# Patient Record
Sex: Male | Born: 1935 | Race: White | Hispanic: No | Marital: Married
Health system: Southern US, Community
[De-identification: ages and names within clinical notes are randomized; demographics above are authoritative.]

---

## 2004-04-24 ENCOUNTER — Emergency Department: Payer: Self-pay | Admitting: General Practice

## 2005-11-24 ENCOUNTER — Inpatient Hospital Stay: Payer: Self-pay | Admitting: Internal Medicine

## 2005-11-24 ENCOUNTER — Other Ambulatory Visit: Payer: Self-pay

## 2005-11-27 ENCOUNTER — Emergency Department: Payer: Self-pay | Admitting: Urology

## 2007-07-25 ENCOUNTER — Ambulatory Visit: Payer: Self-pay | Admitting: General Surgery

## 2007-07-31 ENCOUNTER — Ambulatory Visit: Payer: Self-pay | Admitting: General Surgery

## 2009-05-22 ENCOUNTER — Emergency Department: Payer: Self-pay | Admitting: Emergency Medicine

## 2009-06-12 ENCOUNTER — Emergency Department: Payer: Self-pay | Admitting: Emergency Medicine

## 2009-07-02 ENCOUNTER — Emergency Department: Payer: Self-pay | Admitting: Emergency Medicine

## 2009-07-19 ENCOUNTER — Emergency Department: Payer: Self-pay | Admitting: Emergency Medicine

## 2009-07-25 ENCOUNTER — Emergency Department: Payer: Self-pay | Admitting: Emergency Medicine

## 2009-10-22 ENCOUNTER — Emergency Department: Payer: Self-pay | Admitting: Unknown Physician Specialty

## 2009-10-28 ENCOUNTER — Ambulatory Visit: Payer: Self-pay | Admitting: Urology

## 2009-11-10 ENCOUNTER — Ambulatory Visit: Payer: Self-pay | Admitting: Urology

## 2010-01-04 ENCOUNTER — Inpatient Hospital Stay: Payer: Self-pay | Admitting: Internal Medicine

## 2010-05-14 ENCOUNTER — Ambulatory Visit: Payer: Self-pay | Admitting: Urology

## 2010-05-25 ENCOUNTER — Ambulatory Visit: Payer: Self-pay | Admitting: Urology

## 2011-01-27 ENCOUNTER — Ambulatory Visit: Payer: Self-pay

## 2011-01-29 ENCOUNTER — Ambulatory Visit: Payer: Self-pay | Admitting: Oncology

## 2011-02-27 ENCOUNTER — Ambulatory Visit: Payer: Self-pay | Admitting: Oncology

## 2011-03-30 ENCOUNTER — Ambulatory Visit: Payer: Self-pay | Admitting: Oncology

## 2011-04-30 ENCOUNTER — Ambulatory Visit: Payer: Self-pay | Admitting: Oncology

## 2011-05-08 ENCOUNTER — Emergency Department: Payer: Self-pay | Admitting: Emergency Medicine

## 2011-05-08 ENCOUNTER — Ambulatory Visit: Payer: Self-pay | Admitting: Urology

## 2011-05-08 LAB — CBC
HCT: 38.8 % — ABNORMAL LOW (ref 40.0–52.0)
HGB: 13.2 g/dL (ref 13.0–18.0)
MCH: 30.2 pg (ref 26.0–34.0)
MCV: 89 fL (ref 80–100)
RBC: 4.36 10*6/uL — ABNORMAL LOW (ref 4.40–5.90)
RDW: 12.5 % (ref 11.5–14.5)

## 2011-05-08 LAB — URINALYSIS, COMPLETE
RBC,UR: 130864 /HPF (ref 0–5)
RBC,UR: 2113 /HPF (ref 0–5)
Squamous Epithelial: NONE SEEN
Squamous Epithelial: NONE SEEN
WBC UR: 290 /HPF (ref 0–5)

## 2011-05-08 LAB — PROTIME-INR
INR: 1.7
Prothrombin Time: 20.4 secs — ABNORMAL HIGH (ref 11.5–14.7)

## 2011-05-08 LAB — COMPREHENSIVE METABOLIC PANEL
Albumin: 4.2 g/dL (ref 3.4–5.0)
Alkaline Phosphatase: 81 U/L (ref 50–136)
BUN: 22 mg/dL — ABNORMAL HIGH (ref 7–18)
Bilirubin,Total: 1.8 mg/dL — ABNORMAL HIGH (ref 0.2–1.0)
Calcium, Total: 9.5 mg/dL (ref 8.5–10.1)
Co2: 25 mmol/L (ref 21–32)
Creatinine: 1.08 mg/dL (ref 0.60–1.30)
EGFR (Non-African Amer.): >60
Glucose: 107 mg/dL — ABNORMAL HIGH (ref 65–99)
SGPT (ALT): 23 U/L
Total Protein: 8.3 g/dL — ABNORMAL HIGH (ref 6.4–8.2)

## 2011-05-08 LAB — APTT: Activated PTT: 41.5 secs — ABNORMAL HIGH (ref 23.6–35.9)

## 2011-05-09 LAB — COMPREHENSIVE METABOLIC PANEL
Albumin: 3.4 g/dL (ref 3.4–5.0)
Alkaline Phosphatase: 63 U/L (ref 50–136)
Anion Gap: 13 (ref 7–16)
BUN: 22 mg/dL — ABNORMAL HIGH (ref 7–18)
Bilirubin,Total: 2.3 mg/dL — ABNORMAL HIGH (ref 0.2–1.0)
Calcium, Total: 8.7 mg/dL (ref 8.5–10.1)
Chloride: 102 mmol/L (ref 98–107)
Co2: 24 mmol/L (ref 21–32)
EGFR (African American): >60
EGFR (Non-African Amer.): >60
Osmolality: 282 (ref 275–301)
Potassium: 3.8 mmol/L (ref 3.5–5.1)
SGPT (ALT): 18 U/L
Sodium: 139 mmol/L (ref 136–145)

## 2011-05-09 LAB — CBC
HCT: 32.2 % — ABNORMAL LOW (ref 40.0–52.0)
MCHC: 33.9 g/dL (ref 32.0–36.0)
MCV: 90 fL (ref 80–100)
RDW: 14.1 % (ref 11.5–14.5)

## 2011-05-10 ENCOUNTER — Inpatient Hospital Stay: Payer: Self-pay | Admitting: Internal Medicine

## 2011-05-11 LAB — CBC WITH DIFFERENTIAL/PLATELET
Basophil %: 0.4 %
Eosinophil %: 1.6 %
HCT: 28.5 % — ABNORMAL LOW (ref 40.0–52.0)
Lymphocyte #: 0.9 10*3/uL — ABNORMAL LOW (ref 1.0–3.6)
Lymphocyte %: 10.7 %
MCV: 89 fL (ref 80–100)
Monocyte %: 9.4 %
Neutrophil #: 6.8 10*3/uL — ABNORMAL HIGH (ref 1.4–6.5)
RBC: 3.19 10*6/uL — ABNORMAL LOW (ref 4.40–5.90)
RDW: 13.3 % (ref 11.5–14.5)
WBC: 8.8 10*3/uL (ref 3.8–10.6)

## 2011-05-12 LAB — CBC WITH DIFFERENTIAL/PLATELET
Eosinophil #: 0.3 10*3/uL (ref 0.0–0.7)
Eosinophil %: 3.2 %
Lymphocyte %: 11.5 %
MCH: 30 pg (ref 26.0–34.0)
MCHC: 33.2 g/dL (ref 32.0–36.0)
Monocyte #: 0.8 10*3/uL — ABNORMAL HIGH (ref 0.0–0.7)
Neutrophil %: 75.4 %
Platelet: 208 10*3/uL (ref 150–440)
RBC: 3.29 10*6/uL — ABNORMAL LOW (ref 4.40–5.90)

## 2011-05-12 LAB — PROTIME-INR: INR: 1.2

## 2011-05-14 ENCOUNTER — Emergency Department: Payer: Self-pay | Admitting: Emergency Medicine

## 2011-05-14 LAB — CBC
HGB: 11.9 g/dL — ABNORMAL LOW (ref 13.0–18.0)
MCHC: 33.5 g/dL (ref 32.0–36.0)
Platelet: 323 10*3/uL (ref 150–440)
RDW: 13.7 % (ref 11.5–14.5)
WBC: 7.3 10*3/uL (ref 3.8–10.6)

## 2011-05-14 LAB — URINALYSIS, COMPLETE
Bilirubin,UR: NEGATIVE
Glucose,UR: NEGATIVE mg/dL (ref 0–75)
Protein: NEGATIVE
RBC,UR: 9 /HPF (ref 0–5)
Specific Gravity: 1.005 (ref 1.003–1.030)
Squamous Epithelial: <1

## 2011-05-14 LAB — COMPREHENSIVE METABOLIC PANEL
Albumin: 3.8 g/dL (ref 3.4–5.0)
Anion Gap: 11 (ref 7–16)
Bilirubin,Total: 1.4 mg/dL — ABNORMAL HIGH (ref 0.2–1.0)
Calcium, Total: 8.7 mg/dL (ref 8.5–10.1)
Chloride: 97 mmol/L — ABNORMAL LOW (ref 98–107)
Co2: 27 mmol/L (ref 21–32)
Osmolality: 274 (ref 275–301)
SGPT (ALT): 18 U/L
Sodium: 135 mmol/L — ABNORMAL LOW (ref 136–145)
Total Protein: 8 g/dL (ref 6.4–8.2)

## 2011-05-17 ENCOUNTER — Emergency Department: Payer: Self-pay | Admitting: Emergency Medicine

## 2011-05-28 ENCOUNTER — Ambulatory Visit: Payer: Self-pay | Admitting: Oncology

## 2011-05-31 ENCOUNTER — Ambulatory Visit: Payer: Self-pay | Admitting: Oncology

## 2011-06-01 LAB — PSA: PSA: 30.4 ng/mL — ABNORMAL HIGH (ref 0.0–4.0)

## 2011-06-16 ENCOUNTER — Emergency Department: Payer: Self-pay | Admitting: Emergency Medicine

## 2011-06-28 ENCOUNTER — Ambulatory Visit: Payer: Self-pay | Admitting: Oncology

## 2011-06-28 ENCOUNTER — Ambulatory Visit: Payer: Self-pay | Admitting: Internal Medicine

## 2011-07-02 ENCOUNTER — Ambulatory Visit: Payer: Self-pay | Admitting: Oncology

## 2011-07-02 LAB — CREATININE, SERUM: EGFR (Non-African Amer.): >60

## 2011-07-03 LAB — PSA: PSA: 71.9 ng/mL — ABNORMAL HIGH (ref 0.0–4.0)

## 2011-07-14 ENCOUNTER — Emergency Department: Payer: Self-pay | Admitting: Internal Medicine

## 2011-07-14 LAB — COMPREHENSIVE METABOLIC PANEL
Albumin: 4 g/dL (ref 3.4–5.0)
Alkaline Phosphatase: 77 U/L (ref 50–136)
Bilirubin,Total: 2 mg/dL — ABNORMAL HIGH (ref 0.2–1.0)
Calcium, Total: 8.9 mg/dL (ref 8.5–10.1)
Chloride: 89 mmol/L — ABNORMAL LOW (ref 98–107)
Co2: 31 mmol/L (ref 21–32)
EGFR (African American): 47 — ABNORMAL LOW
EGFR (Non-African Amer.): 40 — ABNORMAL LOW
Glucose: 100 mg/dL — ABNORMAL HIGH (ref 65–99)
Osmolality: 263 (ref 275–301)
Potassium: 3.4 mmol/L — ABNORMAL LOW (ref 3.5–5.1)
SGOT(AST): 48 U/L — ABNORMAL HIGH (ref 15–37)
SGPT (ALT): 16 U/L

## 2011-07-14 LAB — URINALYSIS, COMPLETE
Bilirubin,UR: NEGATIVE
Glucose,UR: NEGATIVE mg/dL (ref 0–75)
RBC,UR: 175 /HPF (ref 0–5)
Specific Gravity: 1.003 (ref 1.003–1.030)
Transitional Epi: <1
WBC UR: 46 /HPF (ref 0–5)

## 2011-07-14 LAB — CBC
HGB: 12.6 g/dL — ABNORMAL LOW (ref 13.0–18.0)
Platelet: 351 10*3/uL (ref 150–440)
RBC: 4.13 10*6/uL — ABNORMAL LOW (ref 4.40–5.90)
WBC: 8.5 10*3/uL (ref 3.8–10.6)

## 2011-07-14 LAB — TROPONIN I: Troponin-I: <0.02 ng/mL

## 2011-07-20 LAB — COMPREHENSIVE METABOLIC PANEL
Alkaline Phosphatase: 63 U/L (ref 50–136)
Anion Gap: 8 (ref 7–16)
BUN: 50 mg/dL — ABNORMAL HIGH (ref 7–18)
Calcium, Total: 8.8 mg/dL (ref 8.5–10.1)
Chloride: 88 mmol/L — ABNORMAL LOW (ref 98–107)
Co2: 33 mmol/L — ABNORMAL HIGH (ref 21–32)
Creatinine: 2.22 mg/dL — ABNORMAL HIGH (ref 0.60–1.30)
EGFR (African American): 30 — ABNORMAL LOW
SGOT(AST): 43 U/L — ABNORMAL HIGH (ref 15–37)
Total Protein: 7.4 g/dL (ref 6.4–8.2)

## 2011-07-20 LAB — CBC CANCER CENTER
Lymphocyte #: 0.4 x10 3/mm — ABNORMAL LOW (ref 1.0–3.6)
Lymphocyte %: 4.6 %
MCH: 29.6 pg (ref 26.0–34.0)
MCHC: 32.9 g/dL (ref 32.0–36.0)
MCV: 90 fL (ref 80–100)
Monocyte %: 8 %
Neutrophil #: 8.1 x10 3/mm — ABNORMAL HIGH (ref 1.4–6.5)
Neutrophil %: 86.6 %
RBC: 3.84 10*6/uL — ABNORMAL LOW (ref 4.40–5.90)
RDW: 12.9 % (ref 11.5–14.5)
WBC: 9.4 x10 3/mm (ref 3.8–10.6)

## 2011-07-23 ENCOUNTER — Inpatient Hospital Stay: Payer: Self-pay | Admitting: Internal Medicine

## 2011-07-23 LAB — COMPREHENSIVE METABOLIC PANEL
Alkaline Phosphatase: 66 U/L (ref 50–136)
BUN: 69 mg/dL — ABNORMAL HIGH (ref 7–18)
Chloride: 85 mmol/L — ABNORMAL LOW (ref 98–107)
Creatinine: 2.15 mg/dL — ABNORMAL HIGH (ref 0.60–1.30)
EGFR (African American): 31 — ABNORMAL LOW
EGFR (Non-African Amer.): 27 — ABNORMAL LOW
Glucose: 118 mg/dL — ABNORMAL HIGH (ref 65–99)
SGOT(AST): 73 U/L — ABNORMAL HIGH (ref 15–37)
SGPT (ALT): 25 U/L
Total Protein: 6.9 g/dL (ref 6.4–8.2)

## 2011-07-23 LAB — CBC
HGB: 10.5 g/dL — ABNORMAL LOW (ref 13.0–18.0)
MCV: 90 fL (ref 80–100)
RBC: 3.56 10*6/uL — ABNORMAL LOW (ref 4.40–5.90)
RDW: 12.8 % (ref 11.5–14.5)
WBC: 12.5 10*3/uL — ABNORMAL HIGH (ref 3.8–10.6)

## 2011-07-23 LAB — CK TOTAL AND CKMB (NOT AT ARMC)
CK, Total: 93 U/L (ref 35–232)
CK, Total: 71 U/L (ref 35–232)
CK-MB: 1.1 ng/mL (ref 0.5–3.6)

## 2011-07-23 LAB — TROPONIN I: Troponin-I: 0.03 ng/mL

## 2011-07-23 LAB — PROTIME-INR: Prothrombin Time: 14.9 secs — ABNORMAL HIGH (ref 11.5–14.7)

## 2011-07-23 LAB — APTT: Activated PTT: 30.4 secs (ref 23.6–35.9)

## 2011-07-24 LAB — CK TOTAL AND CKMB (NOT AT ARMC)
CK, Total: 60 U/L (ref 35–232)
CK-MB: 0.9 ng/mL (ref 0.5–3.6)

## 2011-07-24 LAB — BASIC METABOLIC PANEL
BUN: 67 mg/dL — ABNORMAL HIGH (ref 7–18)
Calcium, Total: 7.6 mg/dL — ABNORMAL LOW (ref 8.5–10.1)
Chloride: 87 mmol/L — ABNORMAL LOW (ref 98–107)
Co2: 30 mmol/L (ref 21–32)
Creatinine: 2.19 mg/dL — ABNORMAL HIGH (ref 0.60–1.30)
EGFR (African American): 31 — ABNORMAL LOW
EGFR (Non-African Amer.): 26 — ABNORMAL LOW
Potassium: 3.7 mmol/L (ref 3.5–5.1)
Sodium: 126 mmol/L — ABNORMAL LOW (ref 136–145)

## 2011-07-24 LAB — CBC WITH DIFFERENTIAL/PLATELET
Basophil #: 0 10*3/uL (ref 0.0–0.1)
Basophil %: 0.2 %
Eosinophil %: 1.1 %
HGB: 9.5 g/dL — ABNORMAL LOW (ref 13.0–18.0)
Lymphocyte #: 0.3 10*3/uL — ABNORMAL LOW (ref 1.0–3.6)
MCH: 31.1 pg (ref 26.0–34.0)
MCHC: 35.1 g/dL (ref 32.0–36.0)
MCV: 89 fL (ref 80–100)
Monocyte %: 0.8 %
Neutrophil #: 8.3 10*3/uL — ABNORMAL HIGH (ref 1.4–6.5)
Neutrophil %: 94.3 %
Platelet: 177 10*3/uL (ref 150–440)
WBC: 8.8 10*3/uL (ref 3.8–10.6)

## 2011-07-25 ENCOUNTER — Ambulatory Visit: Payer: Self-pay | Admitting: Urology

## 2011-07-25 LAB — CBC WITH DIFFERENTIAL/PLATELET
Basophil %: 1.7 %
Eosinophil #: 0 10*3/uL (ref 0.0–0.7)
Eosinophil %: 0.9 %
HCT: 27.2 % — ABNORMAL LOW (ref 40.0–52.0)
Lymphocyte #: 0.2 10*3/uL — ABNORMAL LOW (ref 1.0–3.6)
MCH: 30.6 pg (ref 26.0–34.0)
MCHC: 34.6 g/dL (ref 32.0–36.0)
MCV: 88 fL (ref 80–100)
Monocyte #: 0 x10 3/mm — ABNORMAL LOW (ref 0.2–1.0)
Neutrophil #: 2 10*3/uL (ref 1.4–6.5)
Platelet: 162 10*3/uL (ref 150–440)

## 2011-07-25 LAB — BASIC METABOLIC PANEL
BUN: 66 mg/dL — ABNORMAL HIGH (ref 7–18)
Chloride: 88 mmol/L — ABNORMAL LOW (ref 98–107)
Co2: 30 mmol/L (ref 21–32)
Creatinine: 2.39 mg/dL — ABNORMAL HIGH (ref 0.60–1.30)
EGFR (African American): 28 — ABNORMAL LOW
Potassium: 3.5 mmol/L (ref 3.5–5.1)

## 2011-07-26 LAB — CBC WITH DIFFERENTIAL/PLATELET
Basophil %: 3.3 %
Eosinophil #: 0 10*3/uL (ref 0.0–0.7)
Eosinophil %: 0.8 %
HCT: 26.7 % — ABNORMAL LOW (ref 40.0–52.0)
Lymphocyte #: 0.1 10*3/uL — ABNORMAL LOW (ref 1.0–3.6)
MCHC: 34.6 g/dL (ref 32.0–36.0)
MCV: 90 fL (ref 80–100)
Neutrophil #: 0.1 10*3/uL — ABNORMAL LOW (ref 1.4–6.5)
Neutrophil %: 43.7 %
Platelet: 173 10*3/uL (ref 150–440)
RDW: 12.7 % (ref 11.5–14.5)

## 2011-07-26 LAB — BASIC METABOLIC PANEL
Anion Gap: 10 (ref 7–16)
Calcium, Total: 7.8 mg/dL — ABNORMAL LOW (ref 8.5–10.1)
Co2: 27 mmol/L (ref 21–32)
Osmolality: 278 (ref 275–301)
Sodium: 131 mmol/L — ABNORMAL LOW (ref 136–145)

## 2011-07-26 LAB — URINALYSIS, COMPLETE
Glucose,UR: NEGATIVE mg/dL (ref 0–75)
Ketone: NEGATIVE
Leukocyte Esterase: NEGATIVE
Ph: 6 (ref 4.5–8.0)
Protein: 100
RBC,UR: 800 /HPF (ref 0–5)
Specific Gravity: 1.016 (ref 1.003–1.030)
Squamous Epithelial: <1
WBC UR: 2 /HPF (ref 0–5)

## 2011-07-27 LAB — BASIC METABOLIC PANEL
Anion Gap: 12 (ref 7–16)
Calcium, Total: 7.7 mg/dL — ABNORMAL LOW (ref 8.5–10.1)
Co2: 26 mmol/L (ref 21–32)
Creatinine: 2.04 mg/dL — ABNORMAL HIGH (ref 0.60–1.30)
EGFR (African American): 33 — ABNORMAL LOW
Potassium: 3.7 mmol/L (ref 3.5–5.1)
Sodium: 134 mmol/L — ABNORMAL LOW (ref 136–145)

## 2011-07-27 LAB — CBC WITH DIFFERENTIAL/PLATELET
Basophil #: 0 10*3/uL (ref 0.0–0.1)
Eosinophil #: 0 10*3/uL (ref 0.0–0.7)
HCT: 27.3 % — ABNORMAL LOW (ref 40.0–52.0)
HGB: 9.3 g/dL — ABNORMAL LOW (ref 13.0–18.0)
MCH: 30.6 pg (ref 26.0–34.0)
MCHC: 34 g/dL (ref 32.0–36.0)
MCV: 90 fL (ref 80–100)
Monocyte #: 0.1 x10 3/mm — ABNORMAL LOW (ref 0.2–1.0)
Neutrophil #: 0.1 10*3/uL — ABNORMAL LOW (ref 1.4–6.5)
Platelet: 174 10*3/uL (ref 150–440)
RBC: 3.03 10*6/uL — ABNORMAL LOW (ref 4.40–5.90)
WBC: 0.4 10*3/uL — CL (ref 3.8–10.6)

## 2011-07-28 ENCOUNTER — Ambulatory Visit: Payer: Self-pay | Admitting: Oncology

## 2011-07-28 ENCOUNTER — Ambulatory Visit: Payer: Self-pay | Admitting: Internal Medicine

## 2011-07-28 LAB — CBC WITH DIFFERENTIAL/PLATELET
Basophil #: 0 10*3/uL (ref 0.0–0.1)
Eosinophil #: 0 10*3/uL (ref 0.0–0.7)
Eosinophil %: 1 %
HCT: 26.7 % — ABNORMAL LOW (ref 40.0–52.0)
HGB: 9.1 g/dL — ABNORMAL LOW (ref 13.0–18.0)
Lymphocyte %: 18.3 %
MCH: 30.8 pg (ref 26.0–34.0)
MCHC: 34.2 g/dL (ref 32.0–36.0)
MCV: 90 fL (ref 80–100)
Monocyte %: 32.4 %
Neutrophil #: 0.6 10*3/uL — ABNORMAL LOW (ref 1.4–6.5)
Neutrophil %: 46.7 %
Platelet: 192 10*3/uL (ref 150–440)
RBC: 2.96 10*6/uL — ABNORMAL LOW (ref 4.40–5.90)

## 2011-07-28 LAB — BASIC METABOLIC PANEL
Anion Gap: 13 (ref 7–16)
BUN: 48 mg/dL — ABNORMAL HIGH (ref 7–18)
Chloride: 100 mmol/L (ref 98–107)
Co2: 24 mmol/L (ref 21–32)
EGFR (African American): 35 — ABNORMAL LOW
EGFR (Non-African Amer.): 30 — ABNORMAL LOW
Glucose: 118 mg/dL — ABNORMAL HIGH (ref 65–99)
Potassium: 3.5 mmol/L (ref 3.5–5.1)

## 2011-07-28 LAB — URINE CULTURE

## 2011-07-29 LAB — CBC WITH DIFFERENTIAL/PLATELET
Basophil #: 0 10*3/uL (ref 0.0–0.1)
Basophil %: 0.3 %
Eosinophil #: 0 10*3/uL (ref 0.0–0.7)
HCT: 27.7 % — ABNORMAL LOW (ref 40.0–52.0)
Lymphocyte %: 7 %
MCH: 30.7 pg (ref 26.0–34.0)
MCV: 90 fL (ref 80–100)
Monocyte #: 1.1 x10 3/mm — ABNORMAL HIGH (ref 0.2–1.0)
Monocyte %: 14 %
Neutrophil #: 6.3 10*3/uL (ref 1.4–6.5)
RBC: 3.06 10*6/uL — ABNORMAL LOW (ref 4.40–5.90)
RDW: 13.2 % (ref 11.5–14.5)
WBC: 8.1 10*3/uL (ref 3.8–10.6)

## 2011-07-29 LAB — BASIC METABOLIC PANEL
BUN: 42 mg/dL — ABNORMAL HIGH (ref 7–18)
Calcium, Total: 8 mg/dL — ABNORMAL LOW (ref 8.5–10.1)
Co2: 25 mmol/L (ref 21–32)
Creatinine: 1.85 mg/dL — ABNORMAL HIGH (ref 0.60–1.30)
Osmolality: 289 (ref 275–301)
Potassium: 3.4 mmol/L — ABNORMAL LOW (ref 3.5–5.1)
Sodium: 139 mmol/L (ref 136–145)

## 2011-07-29 LAB — CULTURE, BLOOD (SINGLE)

## 2011-07-30 LAB — CBC WITH DIFFERENTIAL/PLATELET
Basophil #: 0.1 10*3/uL (ref 0.0–0.1)
Basophil %: 0.3 %
Comment - H1-Com2: NORMAL
Eosinophil %: 0.1 %
HCT: 27.8 % — ABNORMAL LOW (ref 40.0–52.0)
HGB: 9.2 g/dL — ABNORMAL LOW (ref 13.0–18.0)
Lymphocyte #: 0.9 10*3/uL — ABNORMAL LOW (ref 1.0–3.6)
Lymphocytes: 2 %
MCV: 91 fL (ref 80–100)
Metamyelocyte: 4 %
Monocyte %: 5.2 %
Monocytes: 10 %
Myelocyte: 1 %
Neutrophil #: 26.5 10*3/uL — ABNORMAL HIGH (ref 1.4–6.5)
Neutrophil %: 91.1 %
Platelet: 241 10*3/uL (ref 150–440)
RBC: 3.07 10*6/uL — ABNORMAL LOW (ref 4.40–5.90)
RDW: 13.2 % (ref 11.5–14.5)

## 2011-07-31 LAB — CBC WITH DIFFERENTIAL/PLATELET
Basophil %: 0.2 %
Eosinophil #: 0 10*3/uL (ref 0.0–0.7)
Eosinophil %: 0.2 %
HCT: 28.2 % — ABNORMAL LOW (ref 40.0–52.0)
HGB: 9.2 g/dL — ABNORMAL LOW (ref 13.0–18.0)
Lymphocyte #: 0.8 10*3/uL — ABNORMAL LOW (ref 1.0–3.6)
Lymphocyte %: 3.5 %
Lymphocytes: 3 %
MCHC: 32.5 g/dL (ref 32.0–36.0)
Monocyte #: 1.1 x10 3/mm — ABNORMAL HIGH (ref 0.2–1.0)
Monocyte %: 4.7 %
Monocytes: 5 %
Myelocyte: 3 %
Neutrophil #: 22.1 10*3/uL — ABNORMAL HIGH (ref 1.4–6.5)
Neutrophil %: 91.4 %
RBC: 3.1 10*6/uL — ABNORMAL LOW (ref 4.40–5.90)
Segmented Neutrophils: 77 %
WBC: 24.1 10*3/uL — ABNORMAL HIGH (ref 3.8–10.6)

## 2011-08-01 ENCOUNTER — Ambulatory Visit: Payer: Self-pay | Admitting: Oncology

## 2011-08-28 ENCOUNTER — Ambulatory Visit: Payer: Self-pay | Admitting: Internal Medicine

## 2011-08-28 ENCOUNTER — Ambulatory Visit: Payer: Self-pay | Admitting: Oncology

## 2011-10-05 ENCOUNTER — Emergency Department: Payer: Self-pay | Admitting: Emergency Medicine

## 2011-10-05 LAB — CBC
HCT: 37.6 % — ABNORMAL LOW (ref 40.0–52.0)
HGB: 12.2 g/dL — ABNORMAL LOW (ref 13.0–18.0)
MCH: 29.7 pg (ref 26.0–34.0)
MCHC: 32.3 g/dL (ref 32.0–36.0)
MCV: 92 fL (ref 80–100)
Platelet: 383 10*3/uL (ref 150–440)
RBC: 4.09 10*6/uL — ABNORMAL LOW (ref 4.40–5.90)
WBC: 18.7 10*3/uL — ABNORMAL HIGH (ref 3.8–10.6)

## 2011-10-05 LAB — COMPREHENSIVE METABOLIC PANEL
Albumin: 2.8 g/dL — ABNORMAL LOW (ref 3.4–5.0)
Alkaline Phosphatase: 191 U/L — ABNORMAL HIGH (ref 50–136)
BUN: 137 mg/dL — ABNORMAL HIGH (ref 7–18)
Bilirubin,Total: 1.7 mg/dL — ABNORMAL HIGH (ref 0.2–1.0)
Co2: 16 mmol/L — ABNORMAL LOW (ref 21–32)
Creatinine: 9.98 mg/dL — ABNORMAL HIGH (ref 0.60–1.30)
EGFR (African American): 5 — ABNORMAL LOW
EGFR (Non-African Amer.): 4 — ABNORMAL LOW
Osmolality: 299 (ref 275–301)
SGOT(AST): 74 U/L — ABNORMAL HIGH (ref 15–37)
SGPT (ALT): 16 U/L
Sodium: 126 mmol/L — ABNORMAL LOW (ref 136–145)
Total Protein: 8.2 g/dL (ref 6.4–8.2)

## 2011-10-28 DEATH — deceased

## 2012-05-20 IMAGING — NM NUCLEAR MEDICINE CARDIAC MULTIPLE UPTAKE GATED ACQUISITION SCAN
3 series · 18 of 18 positions shown · non-contrast
Comparison: None

REASON FOR EXAM: PRE CHEMO
COMMENTS:

PROCEDURE:     KNM - KNM REST MUGA SCAN [DATE] OF [DATE]  - [DATE] [DATE] [DATE]  [DATE]
RESULT:     History: Prechemotherapy
TECHNIQUE: Patient's RBCs were labeled in vitro with 24.71 mCi of Tc 99m
pertechnetate using the standard kit. 3 mL of pyrophosphate was utilized for
tagging. The patient's RBCs were then injected and gated images in the LAO
projection were obtained of the heart at rest. Gated equilibrium data was
used to calculate the left ventricular ejection fraction. Images were
evaluated in cine most.

[Series 1000: lao 45-gated · 3.30mm/px · 6 of 24 frames shown]
[frame 3/24]
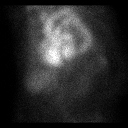
[frame 7/24]
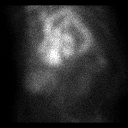
[frame 11/24]
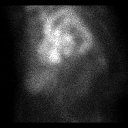
[frame 15/24]
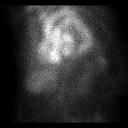
[frame 19/24]
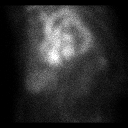
[frame 23/24]
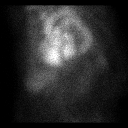

[Series 1000: lao 70-gated · 3.30mm/px · 6 of 24 frames shown]
[frame 3/24]
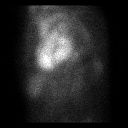
[frame 7/24]
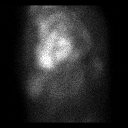
[frame 11/24]
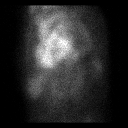
[frame 15/24]
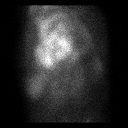
[frame 19/24]
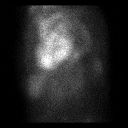
[frame 23/24]
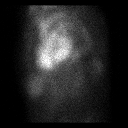

[Series 1000: lao 45-gated (results) · 3.30mm/px · 6 of 24 frames shown]
[frame 3/24]
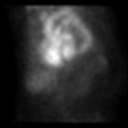
[frame 7/24]
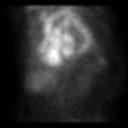
[frame 11/24]
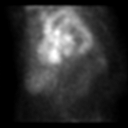
[frame 15/24]
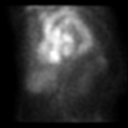
[frame 19/24]
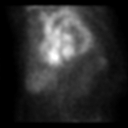
[frame 23/24]
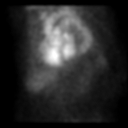

[18 of 18 positions shown; findings below may reference images not displayed]

FINDINGS: Calculated left ventricular ejection fraction is 74%. There is no regional
wall motion abnormality seen on the cine mode images.
IMPRESSION: 1. Left ventricular ejection fraction of 74%.
2. Normal cardiac chamber size and wall motion.

## 2012-05-21 IMAGING — CR DG CHEST 2V
1 series · 2 of 2 positions shown · non-contrast
Comparison: none

REASON FOR EXAM: SOB
COMMENTS:

PROCEDURE:     DXR - DXR CHEST PA (OR AP) AND LATERAL  - July 14, 2011  [DATE]
RESULT:
The lungs are clear. The cardiovascular structures are unremarkable. Changes
of COPD cannot be excluded.

[Series 1: x chest ap · 0.14mm/px · 2 of 2 slices shown]
[im 1/2]
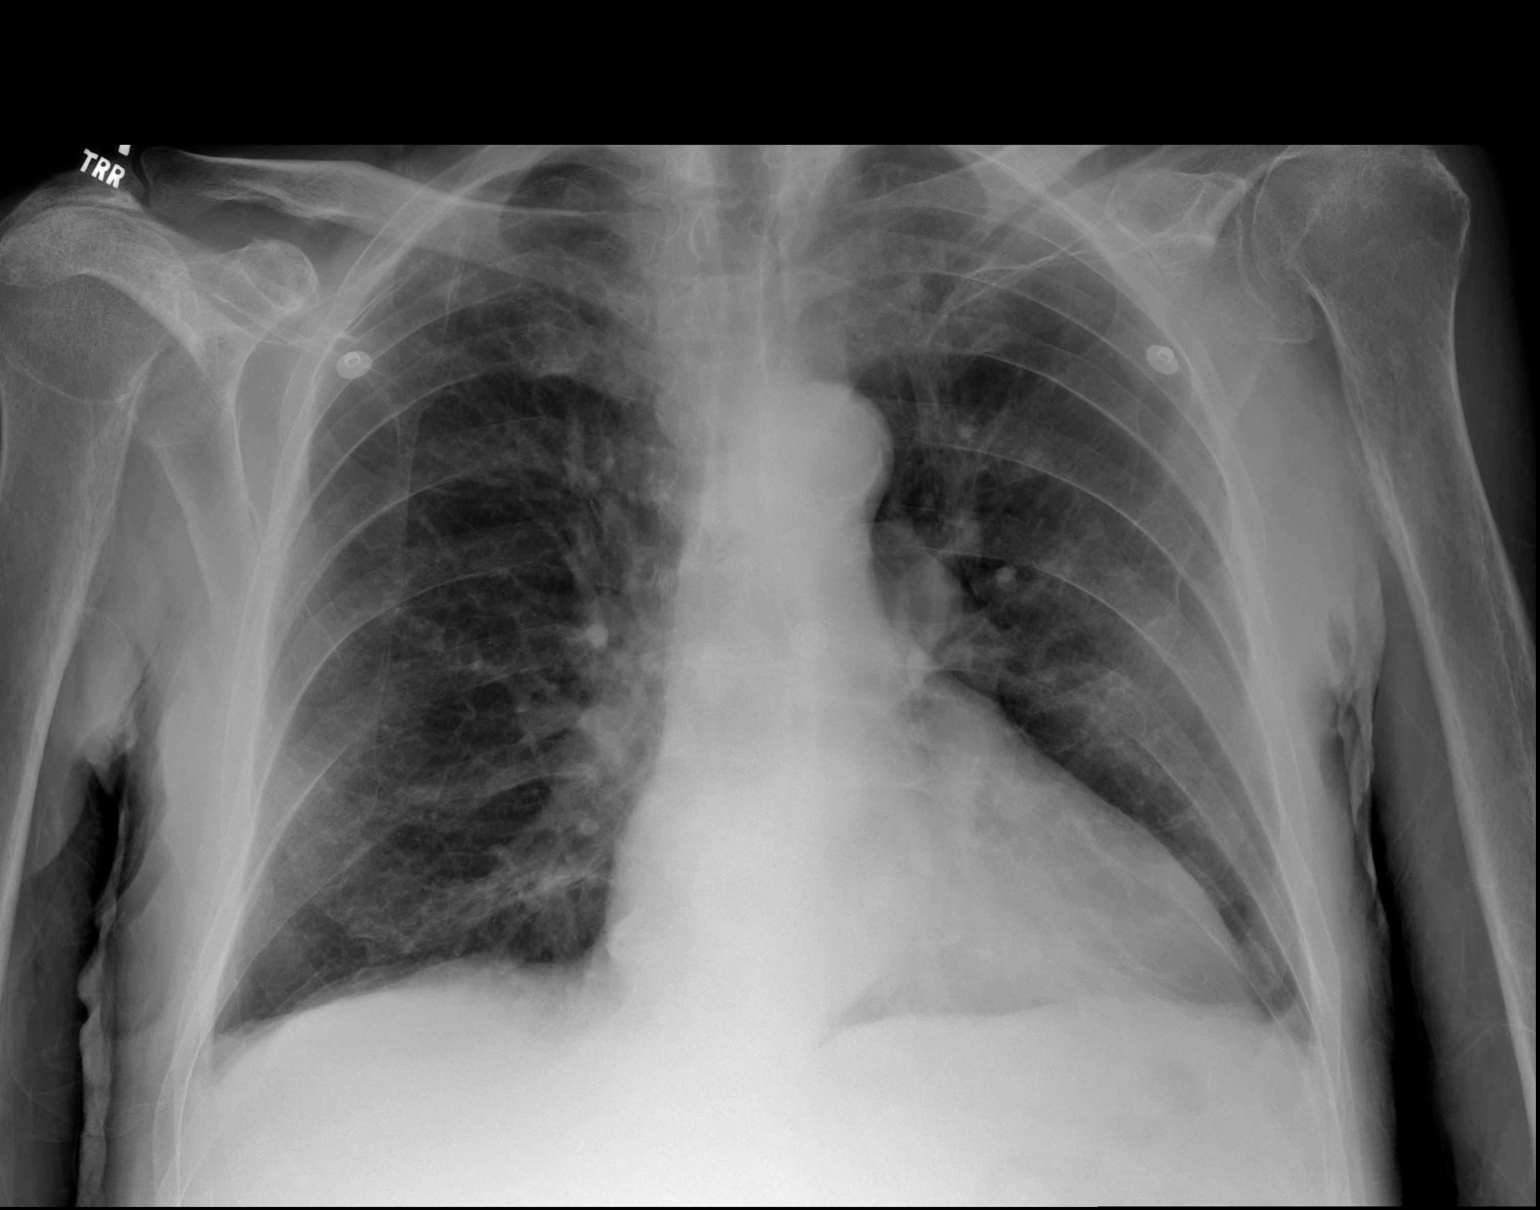
[im 2/2]
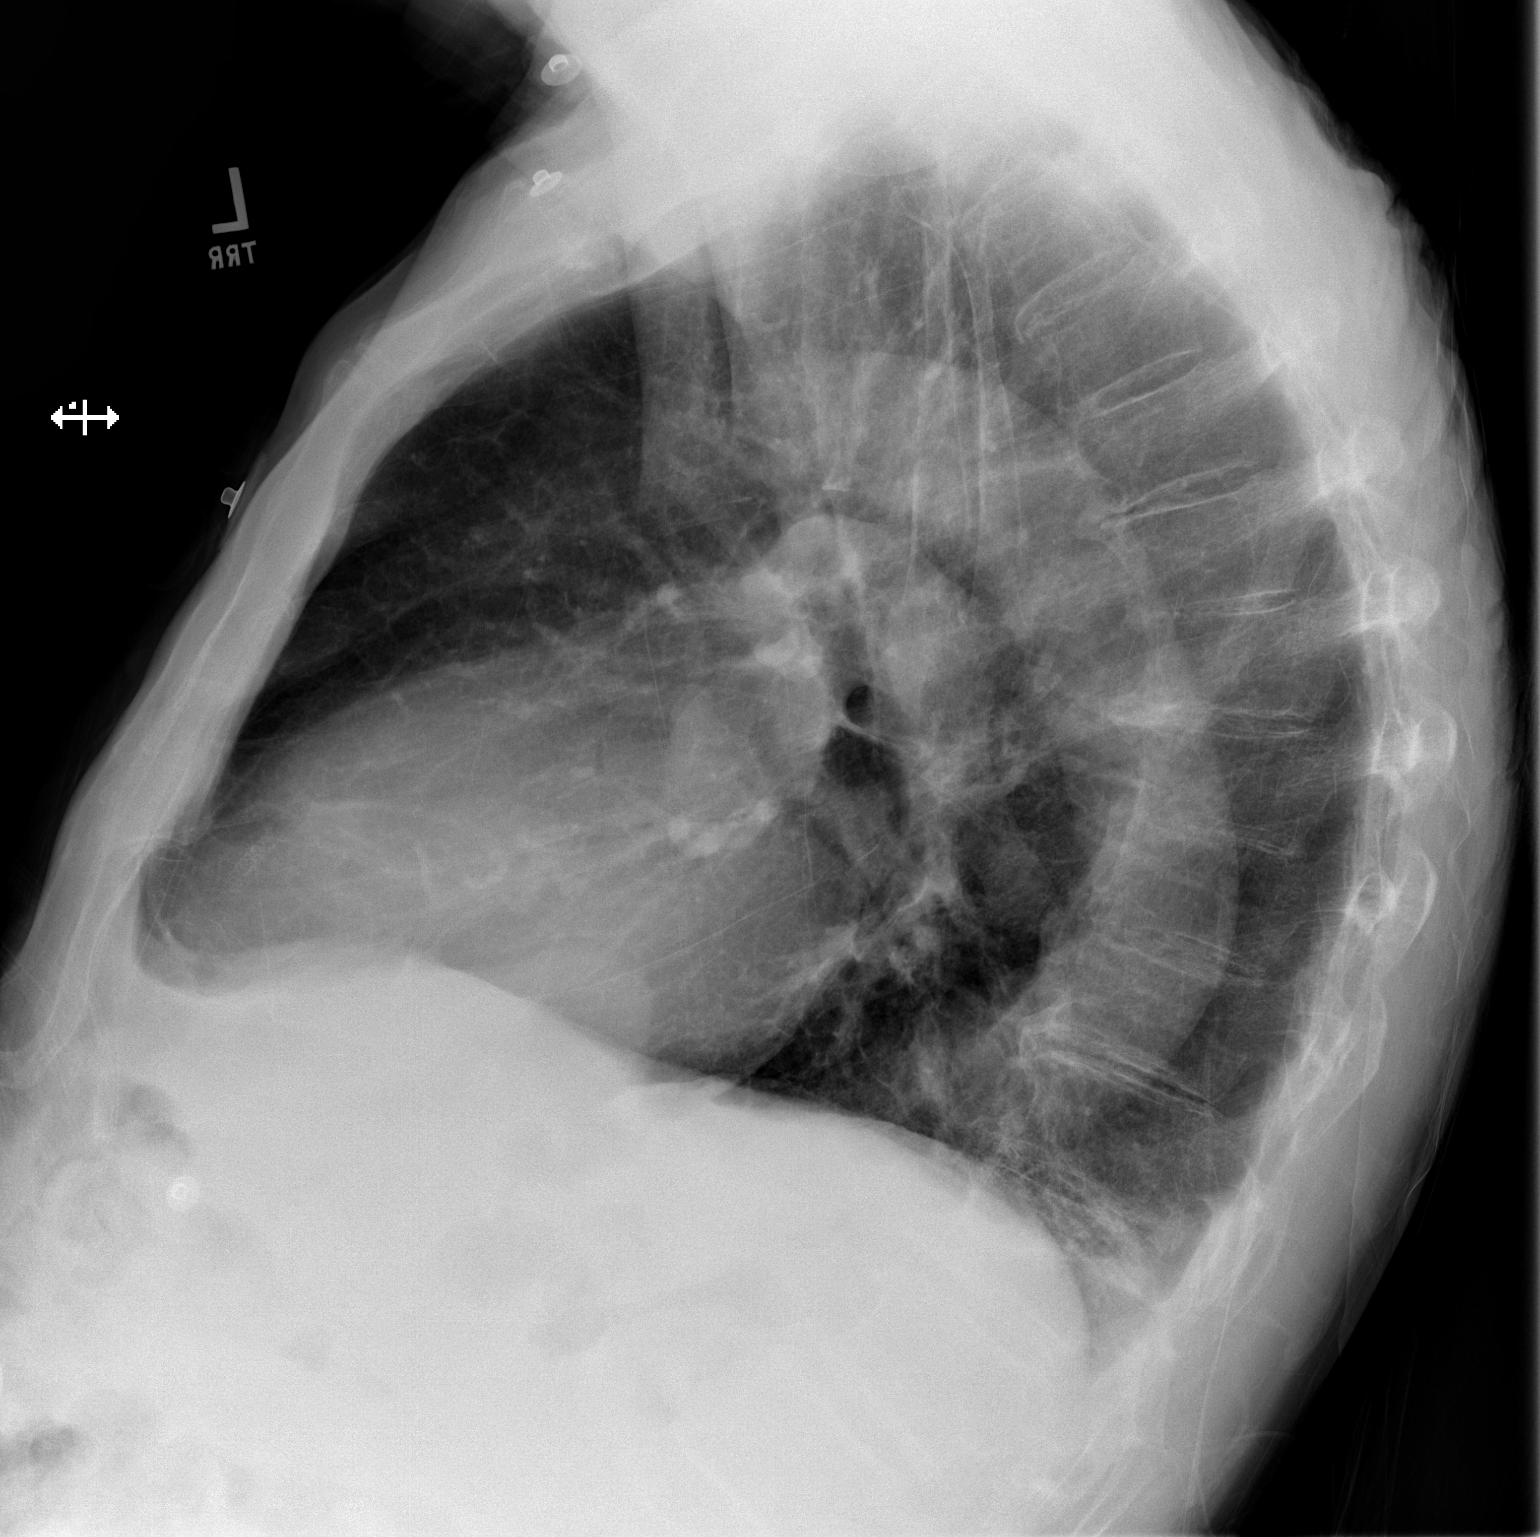

[2 of 2 positions shown; findings below may reference images not displayed]

IMPRESSION: No acute abnormality.

## 2014-07-21 NOTE — Discharge Summary (Signed)
PATIENT NAME:  Jeffery Camacho, Jeffery Camacho MR#:  [id number] DATE OF BIRTH:  02/18/1924  DATE OF ADMISSION:  05/10/2011 DATE OF DISCHARGE:  05/13/2011  DISCHARGE DIAGNOSES:  1. Abdominal pain.  2. Hematuria due to coagulopathy resulting from Coumadin use for atrial fibrillation.  3. History of prostate cancer and benign prostatic hypertrophy.  4. Atrial fibrillation.  5. Hypertension.  6. History of cerebrovascular accident.  7. Hyperlipidemia.  8. Anemia.  9. History of coronary artery disease status post stent.   DISPOSITION: The patient is being discharged home.   FOLLOWUP: 1. Follow up with primary care physician Dr. Dewaine Oatsenny Tate 1 to 2 weeks after discharge.  2. Follow up with cardiologist Dr. Gwen PoundsKowalski. 3. Follow up with urologist Dr. Evelene CroonWolff 1 to 2 weeks after discharge.   DIET: Low sodium.   ACTIVITY: As tolerated.   DISCHARGE MEDICATIONS:  1. Cipro 500 mg b.i.d. for five days. 2. Toprol-XL 25 mg daily.  3. Bacitracin, apply to affected area b.i.d. for five days. 4. Fosamax 70 mg q. weekly.  5. Lasix 40 mg daily.  6. Pravachol 40 mg daily.  7. Casodex 50 mg daily.  8. Calcium with vitamin D b.i.d.   CONSULTATIONS:  1. Cardiology consultation with Dr. Gwen PoundsKowalski.  2. Urology consultation with Dr. Sheppard PentonWolf.  3. Oncology consultation with Dr. [name].   LABORATORY, DIAGNOSTIC, AND RADIOLOGICAL DATA: CT scan of the abdomen and pelvis without contrast showed worsening appearance of the bladder with irregular appearance of the wall with areas of indeterminate thickening. There is some air within the bladder wall to the right. There is dense material within the bladder which could represent thrombus or underlying solid mass. The patient has known prostate cancer. There is a stable nonenhancing area in the right kidney lower pole. There is sclerotic focus within the T12 vertebral body. Abdominal ultrasound showed no abnormality. Microbiology: Blood cultures have been negative so far. INR 1.7 on  admission, 1.7 by the time of discharge. Hemoglobin 13.2 on admission, 9.9 by the time of discharge. CMP essentially normal.   HOSPITAL COURSE: The patient is an 81 year old male with known history of prostate cancer with T12 sclerotic lesion followed by Dr. [name], benign prostatic hypertrophy, hypertension, atrial fibrillation, aortic stenosis, stroke, coronary artery disease status post stents, and hyperlipidemia who presented with abdominal pain and hematuria.  1. Abdominal pain and hematuria: This was felt to be due to bladder obstruction with blood clots. Urology consultation with Dr. Evelene CroonWolff was obtained and the patient was started on CBI. His Coumadin was placed on hold.   CT scan of the abdomen showed no kidney abnormalities, blood clots, and worsening appearance of the bladder. His abdominal ultrasound was normal.  Once the  patient's coagulopathy was improved and Coumadin was stopped, the patient's CBI started clearing up. It was stopped without any adverse events. After stopping the CBI the Foley started draining clear urine. Subsequently the Foley was removed and patient was able to void without any difficulty.  2. History of prostate cancer and benign prostatic hypertrophy: The patient was seen by Dr. [name] who recommended that the patient continue taking his Casodex. 3. Atrial fibrillation: Has been rate controlled. The patient is no longer a candidate for long-term anticoagulation because of severe hematuria times two.  Cardiology consultation with Dr. Gwen PoundsKowalski was obtained who recommended that the patient should not be coagulated any further.  4. Hypertension: The patient's blood pressure was a little low. Therefore, the dose of his Toprol has been reduced.  5. History  of cerebrovascular accident: The patient has been advised to follow up with his primary care physician and once able he should restart his aspirin.  6. Hyperlipidemia: The patient has been advised to continue  statin. 7. Anemia: This was felt to be hemodilutional versus due to blood loss from his acute hematuria. The patient's hemoglobin  fell from 13 to 9.7 and has remained stable thereafter.   DISPOSITION: The patient is being discharged home.   TIME SPENT: 45 minutes.    ____________________________ Darrick Meigs, MD sp:bjt D: 05/13/2011 15:48:11 ET T: 05/14/2011 12:13:19 ET JOB#: [id number]  cc: Darrick Meigs, MD, <Dictator> Jillene Bucks. Arlana Pouch, MD Darrick Meigs MD ELECTRONICALLY SIGNED 05/14/2011 16:49

## 2014-07-21 NOTE — Discharge Summary (Signed)
PATIENT NAME:  Jeffery Camacho, Jeffery M MR#:  Camacho DATE OF BIRTH:  08/31/1923  DATE OF ADMISSION:  07/23/2011 DATE OF DISCHARGE:  07/31/2011  ADMITTING PHYSICIAN: Darrick MeigsSangeeta Panwar, MD  DISCHARGING PHYSICIAN: Enid Baasadhika Gus Littler, MD  PRIMARY CARE PHYSICIAN: Dewaine Oatsenny Tate, MD  PRIMARY CARDIOLOGIST: Arnoldo HookerBruce Kowalski, MD  PRIMARY ONCOLOGIST: Gerarda Fractionimothy Finnegan, MD  DISCHARGE DIAGNOSES:  1. Acute renal failure.  2. Metastatic prostate cancer.  3. Bilateral hydronephrosis.  4. Chronic kidney disease.  5. Drug induced neutropenia secondary to chemotherapy.  6. Oral candidiasis.  7. Right lower lobe pneumonia.  8. Chronic anemia.  9. Elevated liver function tests.  10. Bilateral lower extremity swelling secondary to hypoalbuminemia and questionable metastatic prostate disease involving lymphatics.  11. Atrial fibrillation.  12. History of severe aortic stenosis.  DISCHARGE MEDICATIONS:  1. Roxanol 20 mg/mL - 0.25 to 0.5 mL p.o. sublingual every 1 to 2 hours p.r.n.  2. Lorazepam 0.5 to 1 mg p.o. or sublingual every 2 to 4 hours p.r.n.  3. Ranitidine 150 mg p.o. twice a day.  4. ABHR suppository one per rectum every 4 to 6 hours p.r.n.  5. Metoprolol tartrate 12.5 mg p.o. twice a day. 6. Duke's mouthwash 5 to 10 mL oral every four hours p.r.n.  7. Carafate suspension 1 gram oral every six hours.  8. Fentanyl 25 mcg transdermal every 72 hours. 9. Zofran orally disintegrating tablet 4 mg every six hours p.r.n. for nausea. 10. Colace 100 mg oral bilaterally.  11. 2% lidocaine viscous solution 5 mL every 3 hours p.r.n. for pain.  DISCHARGE DIET: As tolerated.   DISCHARGE ACTIVITY: As tolerated.   LABS AND IMAGING STUDIES: WBC 24.1, hemoglobin 9.2, hematocrit 28.2, and platelet count 237.   Sodium 139, potassium 3.4, chloride 104, bicarbonate 25, BUN 42, creatinine 1.85, glucose 110, and calcium 8.0.   ALT 13, AST 43, alkaline phosphatase 63, total bilirubin 1.5, and albumin 3.5.   Blood  cultures remained negative.   Chest x-ray on admission showed mild interstitial edema and right lower lung heterogeneous opacity representing atelectasis versus pneumonia.   Doppler of the lower extremities bilaterally showed no evidence of any deep venous thrombosis.   Ultrasound of kidneys bilaterally showed mild to moderate bilateral hydronephrosis. No renal mass is present. Foley catheter is present.   Urinalysis and urine culture is negative for any infection.   CT of the abdomen and pelvis showed bibasilar atelectasis/infiltrate consistent with pneumonia with bilateral pleural effusions. Component of congestive heart failure may be present. Multiple hepatic lesions consistent with metastatic disease. Severe prostate enlargement with history of prostate cancer noted. Bladder malignancy could be present versus prostate process present as bladder wall is thickened. Slight increase in bilateral hydronephrosis is present.   BRIEF HOSPITAL COURSE: Jeffery Camacho is an 81 year old elderly Caucasian gentleman with past medical history significant for metastatic prostate cancer, hypertension, atrial fibrillation, and aortic stenosis brought in by family secondary to chest pain and bilateral lower extremity swelling. He was recently found to have prostate cancer and started on chemotherapy. He received prior to being admitted to the hospital.  1. Bilateral lower extremity swelling. Ultrasound ruled out deep vein thrombosis. Could be secondary to impaired fluid drainage secondary to prostate cancer being spread to pelvic and lower extremity lymph nodes since his prostrate cancer was diffusely spread now resulting in multiorgan failure with acute renal failure causing bilateral hydronephrosis. The family has discussed with Dr. [name] and are considering hospice home secondary to overall decline in the patient's condition. 2. For  his right lower lobe pneumonia, he was treated with cefepime while in the hospital,  especially with the neutropenia.  3. Neutropenia secondary to recent chemotherapy. White count went as low as 0.4. He did receive Neupogen in the hospital and currently his white count is elevated secondary to above. He is not being discharged on any antibiotics as he will be going to the hospice home and prior given for comfort care.  4. Oral thrush. He was on fluconazole while in the hospital.  5. Atrial fibrillation. His rate was well controlled. He was not on any anticoagulation because of hematuria with blood clots secondary to being on aspirin and Coumadin in the past.  6. Acute renal failure with bilateral hydronephrosis secondary to enlarged prostate causing outlet obstruction. However, since his prostate cancer is not amenable for any treatment at this point, per Dr. [name], urology, he has not put in any stents in him and the patient will be going to the hospice home. After palliative care meeting and family's discussion with Dr. [name], the patient will be discharged to the hospice home. All the orders were written by the palliative care team. The patient was supposed to leave on 07/30/2011, but because of bed vacancy issues, he was transferred to the hospice home in the morning hours of 07/31/2011. Nothing has changed since his physical examination on 07/30/2011. Please refer to progress notes on 07/30/2011.  PROGNOSIS: Poor.            DISCHARGE DISPOSITION: Hospice home.   TIME SPENT ON DISCHARGE: 40 minutes. ____________________________ Enid Baas, MD rk:slb D: 08/03/2011 15:13:42 ET T: 08/04/2011 11:48:02 ET JOB#: [id number]  cc: Enid Baas, MD, <Dictator> Jillene Bucks. Arlana Pouch, MD Enid Baas MD ELECTRONICALLY SIGNED 08/07/2011 10:50

## 2014-07-21 NOTE — H&P (Signed)
PATIENT NAME:  Jeffery Camacho, Jeffery Camacho MR#:  [id number] DATE OF BIRTH:  31-May-1923  DATE OF ADMISSION:  07/23/2011  REFERRING PHYSICIAN: ER physician, Dr. Mayford KnifeWilliams PRIMARY CARE PHYSICIAN: Dr. Arlana Pouchate  CARDIOLOGIST: Dr. Gwen PoundsKowalski  ONCOLOGIST: Dr. [name] UROLOGIST: Dr. Evelene CroonWolff. Patient's family is requesting that patient not be seen by Dr. Evelene CroonWolff anymore.   CHIEF COMPLAINT: Weakness, chest pain.   HISTORY OF PRESENT ILLNESS: Patient is an 81 year old male with past medical history of benign prostatic hypertrophy, prostate cancer which was recently found to be metastatic, hypertension, atrial fibrillation who was brought in by family for bilateral lower extremity swelling and chest pain. Patient was recently found to have metastatic prostate cancer and started chemotherapy; the last dose was on this past Tuesday. Patient has been having bilateral lower extremity swelling for the past 2 to 3 months which has been progressively getting worse. Patient is being treated with Lasix as an outpatient. Patient's home health nurse felt that the patient's legs were more swollen than usual therefore she wanted to send him to the Emergency Room for further evaluation. Patient also complained of some chest pain. Overall patient's condition has been declining. He has very poor appetite. He has gained fluid weight as per the patient's sons at bedside. The cancer has spread to the lymph nodes/lymphatics in his lower extremities which could be contributing to the leg swelling.   ALLERGIES: Sulfa.   CURRENT MEDICATIONS:  1. Bicalutamide 50 mg daily.  2. Calcium with vitamin D 1 tablet b.i.d.  3. Cipro 250 mg b.i.d.  4. Duragesic patch 12 mcg q.72h.  5. Lasix 40 mg b.i.d.  6. Nucynta 50 mg every six hours p.r.n.  7. Zofran p.r.n.  8. Pravachol 40 mg daily.  9. Toprol-XL 25 mg daily.   PAST MEDICAL HISTORY:  1. Metastatic prostate cancer. 2. Severe aortic stenosis. 3. Benign prostatic  hypertrophy. 4. Hypertension. 5. Hyperlipidemia. 6. Atrial fibrillation.   SOCIAL HISTORY: There is no history of smoking, alcohol or drug abuse. Patient is married, lives with his wife.    FAMILY HISTORY: Mother had diabetes and died of its complications. Father had cerebrovascular accident.   REVIEW OF SYSTEMS: CONSTITUTIONAL: Denies any fever. Reports fatigue, weakness. EYES: Denies any blurred or double vision. ENT: Denies any tinnitus, ear pain. RESPIRATORY: Denies any cough. Reports dyspnea. CARDIOVASCULAR: Reports chest pain. Denies any palpitations. GASTROINTESTINAL: Had some episodes of nausea, vomiting recently. Denies any diarrhea. GENITOURINARY: Denies any dysuria, hematuria. Has an indwelling Foley catheter. ENDOCRINE: Denies any polyuria, nocturia. HEME/LYMPH: Denies any anemia, easy bruisability. INTEGUMENT: Denies any acne, rash. MUSCULOSKELETAL: Reports bilateral lower extremity swelling. Denies gout. NEUROLOGICAL: Reports generalized weakness. Denies any numbness. PSYCH: Denies any anxiety, insomnia.   PHYSICAL EXAMINATION:  VITAL SIGNS: Temperature 94.8, respiratory rate 26, pulse 94, blood pressure 103/68, pulse oximetry 97% on room air.   GENERAL: Patient is elderly, chronically ill-appearing male sitting in bed, not in acute distress.   HEAD: Atraumatic, normocephalic.   EYES: Sunken. There is pallor. No icterus or cyanosis. Pupils are equal, round, and reactive to light and accommodation. Extraocular movements intact.   ENT: Dry mucous membranes. No oropharyngeal erythema or thrush.   NECK: Supple. No masses. No JVD or thyromegaly.   CHEST WALL: No tenderness to palpation. Not using accessory muscles of respiration. No intercostal muscle retractions.   LUNGS: Bilateral basal crepitations. No wheezing, rhonchi.   CARDIOVASCULAR: S1, S2 irregularly irregular. There is a systolic murmur. No rubs or gallops.   ABDOMEN: Soft, nondistended. No guarding.  No rigidity.  Mild generalized tenderness especially in the suprapubic area.   GENITOURINARY: Patient has a Foley catheter in place.   SKIN: No rashes or lesions. Chronic age-related skin changes.   EXTREMITIES: Patient has severe pitting edema all the way up his thighs. His legs are very tender to touch. Poorly palpable dorsalis pedis.   MUSCULOSKELETAL: No cyanosis or clubbing.   NEUROLOGIC: Awake, alert. Generalized weakness. Nonfocal exam. Cranial nerves grossly intact.   PSYCHIATRIC: Appears very depressed.   LABORATORY, DIAGNOSTIC AND RADIOLOGICAL DATA: Chest x-ray shows heterogenous right lower lung opacity, atelectasis versus infection. Cardiac enzymes normal. White count 12.5, hemoglobin 10.5, hematocrit 31.9, platelets 235, glucose 115, BUN 69, creatinine 2.15, sodium 122, potassium 3.7, chloride 85, CO2 28, calcium 8.0, bilirubin 2.1, AST 73, albumin 2.1.       ASSESSMENT AND PLAN: 81 year old male with past medical history of metastatic prostate cancer, severe aortic stenosis, atrial fibrillation, hypertension, hyperlipidemia presents with chest pain and weakness.  1. Chest pain. This is a possibly due to right lower lung pneumonia. Will check serial cardiac enzymes. Patient is not getting any further anticoagulation because he developed severe hematuria from anticoagulants in the past.  2. Right lung base opacity, possibly pneumonia. Patient has leukocytosis. Will obtain blood cultures and start on empiric antibiotics. Will also put on breathing treatments. 3. Hyponatremia. Patient has been treated with high doses of diuretics/Lasix recently for bilateral lower extremity swelling which could have resulted in the hyponatremia. Clinically patient appears very dehydrated. Will start on IV fluids and monitor sodium closely.  4. Leukocytosis, possibly reactive/due to acute infection. Will monitor closely.  5. Anemia, likely of chronic disease. Appears stable as compared to prior levels.   6. Hyperglycemia, possibly reactive. Patient has no history of diabetes. 7. Acute renal failure. This could be due to severe dehydration resulting from excessive diuretic use. Will hydrate with IV fluids. Monitor strict ins and outs. Avoid nephrotoxic medications. Follow renal function. 8. Elevated bilirubin and AST, possibly due to acute illness. Has been elevated in the past.  9. Hyperlipidemia. Will hold statin therapy in view of elevated AST.  10. Bilateral lower extremity swelling, possibly multifactorial due to malnutrition with poor appetite, lymphatic involvement with metastatic prostate cancer. Will continue with TED hose placement. Will hold diuretic therapy since patient is very dehydrated and hyponatremic. Check Doppler's to rule out any deep vein thrombosis.  11. Atrial fibrillation. Appears to be rate controlled. Will continue low dose beta blocker. Patient is off anticoagulation because he developed severe hematuria with blood clots in the bladder while he was on aspirin and Coumadin.  12. CODE STATUS: Patient has not discussed with his family. They want more time to think about. Patient may benefit from a palliative care consult.   Reviewed old medical records, discussed with the ED physician, discussed with the patient and his sons at bedside the plan of care and management.   TIME SPENT: 75 minutes.   ____________________________ Darrick Meigs, MD sp:cms D: 07/23/2011 16:14:11 ET T: 07/23/2011 16:42:17 ET  JOB#: [id number] cc: Katherina Right C. Arlana Pouch, MD Darrick Meigs MD ELECTRONICALLY SIGNED 07/23/2011 17:22

## 2014-07-21 NOTE — Consult Note (Signed)
Impression: 81yo WM w/ h/o metastatic prostate CA admitted with febrile neutropenia.  I reviewed his CXR with radiology.  There is not an obvious infiltrate.  He is currently been treated with levofloxacin and ceftriaxone/azithro.  Either would be adequate to treat CAP, but with febrile neutropenia, a broader spectrum coverage including Pseudomonas and anaerobes is often needed.  His prior CT from earlier this month showed some hydronephrosis.  Nephrology did not want to place stents due to his neutropenia.   Will change his antibiotics to cefepime. Continue fluconazole for possible oral Candidiasis.  If he continues to have fever, will consider changing to micofungin as only about half of Candida spp isolated are C. albicans and of the remaining 50%, not all are fluconazole sensitive. Will obtain a CT of the chest/abd/pelvis to get a better look for pneumonia and to assess if his hydronephrosis has worsened. If necessary, will get percutaneous nephrostomy tubes placed.  Electronic Signatures: Erico Stan, Rosalyn GessMichael E (MD)  (Signed on 29-Apr-13 17:00)  Authored  Last Updated: 29-Apr-13 17:00 by Kenetha Cozza, Rosalyn GessMichael E (MD)

## 2014-07-21 NOTE — Consult Note (Signed)
PATIENT NAME:  Jeffery Camacho, [name] MR#:  [id number] DATE OF BIRTH:  December 01, 1923  DATE OF CONSULTATION:  05/10/2011  REFERRING PHYSICIAN:   Dr. Karolee OhsAmir CONSULTING PHYSICIAN:  Suszanne ConnersMichael R. Evelene CroonWolff, MD  REASON FOR CONSULTATION: Hematuria.   HISTORY OF PRESENT ILLNESS: Mr. Jeffery Camacho is an 81 year old white male who presented to the Emergency Room with urinary retention due to clots. He underwent Foley catheter placement on 02/09. Apparently he went home but had some difficulty voiding again. He was readmitted last night and started on CBI. He had undergone microwave thermal therapy in March 2011 for benign prostatic hypertrophy and green light laser in August 2011.   PERTINENT LABORATORY DATA:  Hematocrit of 32.2% and a creatinine of 1.2 mg/dl on 06/5400/10.   IMPRESSION:  1. Clot urinary retention.  2. 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 3. Exposure to Coumadin.   PLAN:  1. Continue CBI and avoid Coumadin until urine clears. 2. Also strongly consider discontinuing Coumadin, as this is his second life-threatening bleeding episode related to the Coumadin. 3. Evaluate upper tract with CT to determine if there are any surgical or urological issues that can be corrected possibly causing this bleeding. Otherwise, the most likely cause of the bleeding is the exposure to the Coumadin in relationship to his significant prostate cancer. 4. Also would consult oncology.   ____________________________ Suszanne ConnersMichael R. Evelene CroonWolff, MD mrw:bjt D: 05/10/2011 07:45:15 ET T: 05/10/2011 09:42:17 ET JOB#: [id number]  Suszanne ConnersMICHAEL R WOLFF MD ELECTRONICALLY SIGNED 05/10/2011 10:43

## 2014-07-21 NOTE — Consult Note (Signed)
Urology Consultation Report: MD: Dr. Conrad BurlingtonhenMD: Marin OlpJay H. Rosy Estabrook, M.D. Urologist: Pt is in the process of transitoning care from Dr. Evelene CroonWolff to Dr. Achilles Dunkope for Consultation: Metastatic, Castration-Resistant Prostate Cancer with b/L Hydro/ARI Pt is fatigued, but alert and responsive, however is a poor historian. The history is obtained from the pt's family (Wife, Sons) and the Medical Records.y.o. WM who was referred to the [facility] ER on 07/23/2011 for evaluation of progressive SOB/LE Edema.the ER, the pt also c/o Chest Pain. CXR revealed a RLL opacity (?atelectasis vs infection). WBC 12.5k, Cr 2.15. Na+ 122pt is admitted for further evaluation and management of Chest Pain, empiric abx's for possible pneumonia, hyponatremia, and ARI.on HD #1, 07/24/2011, revealed b/L mild-mod hydronephrosis, which represents possible progression from a Chest/Abd/Pelvis CT 07/08/2011 which revealed mild right hydro to the mid-pelvis with a possible mass vs adenopathy in the right pelvis, as well as a possible bladder mass. Hx: ([facility] and the Pt/family)TUMT ProstateGreenlight PVPBicalutamide PO and Lupron IM initiated (61month depot) for GG 5+5 adenocarcinoma of the prostate (ACP)Cr 1.34CT: T12 Lesion; Bone Scan: no abnormal focus of increased tracer activity at T12Chest/Abd/Pelvis CT: multiple hepatic lesions; mild right hydro to the mid pelvis with ?mass vs adenopathy and bladder mass Cycle 1 of Docetaxal (reduced dose: 60mg /m2) IV initiated; Cr 2.22 pt denies any flank pain, fever or rigors Severe Aortic StenosisAtrial Fibrillation (off anticoagulation due to hematuria)CAD s/p Stent x1 (8-10 yrs ago) - no h/o MIHyperlipidemiaMetastatic Prostate Cancer Bicalutamide 50mg , 1 po qDayLupron 30mg  IM q5961months (last dose 06/2011)Cipro 250mg  po bidLasix 40mg  po bidPravachol 40mg  , 1 po qDayToprol XL 25mg  Duragesic Patch 12mcg q72hrsCalcium/Vit D po bidNucynta 50mg  po q6hrs prnZofran prn Sulfa (reaction unknown) Denies any tobacco or  alcohol use. Married, lives with wife.No prostate cancer. Positive for DM (mother). Positive for CVA (father). General (positive for fatigue; no F/C, wt loss); HEENT (no recent URI); Resp (progressive DOE; no cough); CV (denies CP/angina); GI (sore throat with difficulty swallowing; constipation w/small hard stools); Endocrine (denies polyuria); Heme (no bleeding diathesis); Skin (epithelial red streaking thighs, R>L); Neuro (no lateralizinf weakness); Psych (no anxiety/depression) VS per ChartWD, thin WM in NADNC/AT, EOMI/Anictericno masses/bruitsCTA, nL effortIrreg/irreg rate/rhythm, with Gr 4/6 sys murmurs, no gallops/rubsNT/ND, no CVATmarked penoscrotal edema - NT; foley in placedeferred2+ pitting edema to the hips b/L with epithelial red streaking (R>L) - NTno rashes/lesion on the head/necknon-focalAlert and responsive, appropriate, but with a depressed affect per chart  b/L Hydro with Renal Insufficiency - likely due to locally advanced prostate cancer. Discussed with the pt/family, the likely difficulty with obtaining retrograde access for internal stenting, as well as the likely suboptimal decompression due to the extrinsic component of the obstruction. If relief of obstruction is desired to prevent further loss of renal function (and potentially recover some renal function), b/L percutaneous nephrostomies would be recommended (potential risks/complications reviewed with the pt/family). Future attempts at antegrade internal stent placement could be considered pending the pt's response to treatment of his advanced prostate cancer. Acute on CRI - see above  b/L PCN's, if the pt and family desire.Pt will keep his appt with Dr. Achilles Dunkope to establish long-term urologic care you for the opportunity to participate in the care of this most pleasant gentleman.  Electronic Signatures: Marin OlpKim, Jeannett Dekoning H (MD)  (Signed on 28-Apr-13 13:05)  Authored  Last Updated: 28-Apr-13 13:05 by Marin OlpKim, Redford Behrle H (MD)

## 2014-07-21 NOTE — Consult Note (Signed)
Brief Consult Note: Diagnosis: Gross hematuria. Prostate cancer. Exposure to coumadin.   Patient was seen by consultant.   Consult note dictated.   Recommend further assessment or treatment.   Orders entered.   Comments: Evaluate upper tracts with CT for any other sources of bleeding. Most likely the bleeding is due to coumadin-strongly consider avoiding coumadin. This is the second bleeding episode that has occurred while he was on coumadin.  Electronic Signatures: Orson ApeWolff, Michael R (MD)  (Signed 11-Feb-13 07:36)  Authored: Brief Consult Note   Last Updated: 11-Feb-13 07:36 by Orson ApeWolff, Michael R (MD)

## 2014-07-21 NOTE — Consult Note (Signed)
PATIENT NAME:  Jeffery Camacho, Jeffery Camacho MR#:  [id number] DATE OF BIRTH:  Feb 22, 1924  DATE OF CONSULTATION:  07/26/2011  REFERRING PHYSICIAN:  Dr. Imogene Burn  CONSULTING PHYSICIAN:  Rosalyn Gess. Pearson Picou, MD  REASON FOR CONSULTATION: Febrile neutropenia.   HISTORY OF PRESENT ILLNESS: Patient is an 81 year old white man with a past history significant for metastatic prostate cancer who was admitted on 04/26 after having finished his first round of chemotherapy last week. He had been having bilateral lower extremity edema for the last several months, however, a few days prior to admission he began having an erythematous rash over the dorsum of his thighs. This was somewhat pruritic but not tender. There was no drainage from these lesions. He had been taking Lasix for the edema and on the day of admission he developed some chest pains. He had been having some generalized weakness with decreased p.o. intake as well. He was admitted to the hospital and has been started on azithromycin, ceftriaxone and Levaquin; fluconazole was added today. The patient is a fairly poor historian and the history is obtained mostly from his family. On admission his white count was 12.5 but has rapidly come down and today was 0.3. His ANC is 0.1. He has had some low-grade fevers up to 100.1. Prior CT scan of the chest, abdomen, and pelvis from 04/11 demonstrated mild hydronephrosis as well as mild hydroureter on the right related to a diffuse immediate attenuating amorphus masslike area in the urinary bladder thought to be related to metastatic prostate cancer. There was also evidence for adenopathy versus metastatic disease in the right side of the pelvis   ALLERGIES: Sulfa.   PAST MEDICAL HISTORY:  1. Metastatic prostate cancer.  2. Severe aortic stenosis.  3. Benign prostatic hypertrophy.  4. Hypertension.  5. Hypercholesterolemia.  6. Atrial fibrillation.   SOCIAL HISTORY: Patient lives with his wife. He does not smoke. He does not  drink. No injecting drug use history.   FAMILY HISTORY: Positive for diabetes in his mother and stroke in his father.    REVIEW OF SYSTEMS: GENERAL: He has had some low-grade fever in the hospital but had not been having significant fevers, chills, or sweats at home. He has had some malaise and fatigue, however. HEENT: No headaches, sinus congestion. He has had some sore throat and dry mouth. He has been having some soreness in his mouth but did not describe it as ulcerations. NECK: No swollen glands. No stiffness. RESPIRATORY: Occasional cough. No significant sputum production. No shortness of breath. CARDIAC: He had some chest pain on admission that is now resolved. He has no palpitations. He has had significant peripheral edema that has been worsening over the last several months. GASTROINTESTINAL: No nausea, no vomiting, no abdominal pain. No flank pain. No change in his bowels. GENITOURINARY: No change in his urine. In the past he has had hematuria but this has not been repeated recently. No dysuria or increased urination. MUSCULOSKELETAL: He has generalized achiness but no focal joint or muscle pain. SKIN: He has a red rash over the thighs that is fairly new. NEUROLOGIC: He is somewhat more lethargic than usual. No seizures. No loss of consciousness. PSYCHIATRIC: No current complaints   PHYSICAL EXAMINATION:  VITAL SIGNS: T-max 100.1, T-current 98.9, pulse 111, blood pressure 107/73, 93% on room air.   GENERAL: 81 year old white man appearing somewhat uncomfortable but in no acute distress.   HEENT: Normocephalic, atraumatic. Pupils equal, reactive to light. Extraocular motion intact. Sclerae, conjunctivae, and lids are without  evidence for emboli or petechiae. Oropharynx shows no erythema or exudate. There were no obvious areas of thrush in the oropharynx.   NECK: Midline trachea. No lymphadenopathy. No thyromegaly.    LUNGS: Clear to auscultation bilaterally with good air movement. No focal  consolidation.   CARDIAC: Regular rate and rhythm without murmur, rub, or gallop.   ABDOMEN: Soft, nontender, nondistended. No hepatosplenomegaly. No hernia is noted. No CVA tenderness.   EXTREMITIES: He had bilateral lower edema up into the lower thigh. There was no obvious tenosynovitis.   SKIN: He had erythematous subcutaneous papules that were nonpalpable, nontender and mildly blanching. They had coalesced in some area and were mainly on the anterior aspect of the thighs bilaterally. On the right it had extended down below the knee more prominently than the left. There were no stigmata of endocarditis, specifically no Janeway lesions nor Osler nodes.   NEUROLOGIC: The patient was awake but somewhat confused. He appeared to be fairly lethargic.   PSYCHIATRIC: Difficult to assess but his mood in general appeared to be normal.   LABORATORY, DIAGNOSTIC AND RADIOLOGICAL DATA: BUN 54, creatinine 2.10, bicarbonate 27, anion gap 10, white count 0.3 with hemoglobin 9.2, platelet count 173, ANC 0.1. White count on admission was 12.5. White count on 04/23 was 9.4 with an ANC of 8.1. Blood cultures from admission show no growth. Urinalysis from 04/17 shows negative nitrites, 2+ leukocyte esterase, 175 red cells, 46 white cells. No culture was obtained at that time. No urine culture has been obtained this hospitalization A chest x-ray from 04/26 shows mild interstitial edema. There is a heterogeneous right lower lobe opacity representing either atelectasis or infection. This was reviewed with radiology and did not appear to be an obvious infiltrate. Bilateral lower extremity Doppler showed no evidence for deep vein thrombosis. Renal ultrasound showed mild to moderate bilateral hydronephrosis.   IMPRESSION: 81 year old white man with history of metastatic prostate cancer admitted with febrile neutropenia.   RECOMMENDATIONS:  1. I reviewed his chest x-ray with radiology. There is not an obvious infiltrate.  He is currently being treated with levofloxacin and ceftriaxone/azithromycin. Either will be adequate to treat community-acquired pneumonia but with febrile neutropenia broader spectrum coverage including pseudomonas and anaerobes is often needed. His prior CT from earlier this month showed some hydronephrosis. Nephrology did not want to place stents due to his neutropenia.  2. Will change his antibiotics to cefepime.  3. Would continue the fluconazole for possible oral candidiasis. If he continues to have fever will consider changing to micafungin as only about half of Candida species isolated are C albicans and of the remaining 50%, not all are fluconazole sensitive.  4. Will obtain a CT scan of the chest, abdomen, and pelvis to better look for pneumonia and to assess if his hydronephrosis has worsened. If necessary will get percutaneous nephrostomy tubes placed.  5. Will obtain a urinalysis and culture as this has not been done yet this hospitalization.   This is a high-level infectious disease consult. Thank you very much for involving me in Jeffery Camacho care.  ____________________________ Rosalyn GessMichael E. Yarel Kilcrease, MD meb:cms D: 07/26/2011 17:27:35 ET T: 07/27/2011 07:38:17 ET JOB#: [id number]  cc: Rosalyn GessMichael E. Field Staniszewski, MD, <Dictator> Latrisa Hellums E Geoffry Bannister MD ELECTRONICALLY SIGNED 07/27/2011 15:03

## 2014-07-21 NOTE — H&P (Signed)
PATIENT NAME:  Jeffery Camacho, [name] MR#:  [id number] DATE OF BIRTH:  May 14, 1923  DATE OF ADMISSION:  05/10/2011  PRIMARY CARE PHYSICIAN: Dewaine Oatsenny Tate, MD  UROLOGIST: Anola GurneyMichael Wolff, MD  CHIEF COMPLAINT: Suprapubic abdominal pain associated with bladder outlet obstruction and gross hematuria.   HISTORY OF PRESENT ILLNESS: Jeffery Camacho is an 81 year old pleasant, Caucasian male with past medical history of prostatic cancer and also history of benign prostatic hypertrophy who underwent microwave treatment in March 2011 then laser therapy in August 2011. The patient came to the emergency room for evaluation of gross hematuria that started a few days ago. He was evaluated here by Dr. Alyce PaganMcNamara as part of urology consultation. The patient had Foley catheter insertion and three-way irrigation to remove the clots. The patient was sent home with the Foley catheter. However, he is coming back with obstruction of the catheter and development of intermittent suprapubic abdominal distention associated with pain. Dr. Alyce PaganMcNamara was reconsulted gain and he recommended admission to the hospital for pain control and evaluation in the morning by his main urologist, Dr. Evelene CroonWolff.   REVIEW OF SYSTEMS: CONSTITUTIONAL: The patient denies having any fever. No chills. No fatigue. EYES: Denies any blurring of vision. No double vision. ENT: No hearing impairment. No sore throat or dysphagia. CARDIOVASCULAR: No chest pain or shortness of breath. No syncope. RESPIRATORY: No shortness of breath. No cough. No sputum production. GASTROINTESTINAL: No nausea, no vomiting, and no diarrhea. He has pain of the suprapubic area. Right now he is more comfortable. GENITOURINARY: Gross hematuria. No dysuria. MUSCULOSKELETAL: No joint pain or swelling. No muscular pain or swelling. INTEGUMENTARY: No skin rash. No ulcers. NEUROLOGIC: No focal weakness. No seizure activity. No headache.   PAST MEDICAL HISTORY:  1. History of prostatic cancer.  2. History of  benign prostatic hypertrophy.  3. History of systemic hypertension.  4. History of atrial fibrillation.  5. History of aortic stenosis. 6. History of stroke.  7. History of coronary artery disease, status post stents. 8. Hyperlipidemia.  9. History of bilateral hernia repair.   ADMISSION MEDICATIONS:  1. Toprol-XL 50 mg a day. 2. Pravachol 40 mg a day. 3. Lasix 40 mg a day. 4. Coumadin 3 mg a day. 5. Calcium and vitamin D once a day.  6. Bicalutamide 50 mg once a day.  7. Aspirin 81 mg a day. 8. Fosamax 70 mg once a week.  9. Ciprofloxacin started recently for treatment of urinary tract infection; he has three tablets left.   ALLERGIES: Sulfa.   PHYSICAL EXAMINATION:   VITAL SIGNS: Blood pressure 118/64, respiratory rate 20, pulse 86, temperature 97.5, and oxygen saturation 100%.   GENERAL APPEARANCE: Elderly male lying in bed in no acute distress at the time of my examination.   HEAD AND NECK: Examination unremarkable. No pallor. No icterus. No cyanosis.   EARS, NOSE, AND THROAT: No hearing impairment. Nasal mucosa, lips, and tongue were normal.   EYES: Normal iris and conjunctivae. Pupils are about 4 to 5 mm, equal, and reactive.   NECK: Supple. Trachea at midline.   HEART: Normal S1 and S2. No S3 or S4. There is systolic murmur, grade II, at the apex and aortic area. No carotid bruits.   RESPIRATORY: Normal breathing pattern without use of accessory muscles. No rales. No wheezing.   ABDOMEN: Soft. No tenderness. No masses.   SKIN: No ulcers. No subcutaneous nodules.   MUSCULOSKELETAL: No joint swelling. No clubbing.   NEUROLOGIC: Cranial nerves II through XII are intact. No  focal motor deficit.   LABORATORY FINDINGS: CBC: White count 11,000, hemoglobin 10.9, hematocrit 32, platelet count 222. Glucose 122, BUN 22, creatinine 1.2, sodium 139, potassium 3.8. Total protein 6.9, albumin 3.4, bilirubin 2.3. This is slightly more elevated than previously, that is yesterday  it was 1.8. Liver transaminases were normal. Prothrombin time 21. INR 1.8.   A urinalysis done yesterday showed bloody urine, 116 white blood cells and +2 bacteria.   ASSESSMENT:  1. Lower abdominal pain secondary to bladder outlet obstruction with blood clots. This is intermittently happening to right now and the Foley catheter is draining blood. We need to continue irrigation of the Foley catheter.  2. Gross hematuria for evaluation.  3. History of prostatic cancer and benign prostatic hypertrophy as well.  4. History of atrial fibrillation.  5. History of aortic stenosis. 6. Chronic anticoagulation with Coumadin.  7. His other medical problems include hypertension, history of stroke, history of coronary artery disease, history of bilateral hernia repair, and history of hyperlipidemia.   PLAN: Continue Foley catheter and irrigation. Pain control if needed with small dose of morphine. Urologic consultation with Dr. Evelene Croon. The patient was already seen by Dr. Alyce Pagan. I will repeat a CBC in the morning. There is slight elevation of bilirubin. I will order ultrasound of the liver. I will continue his home medications as listed above, but I will hold Coumadin and aspirin temporarily. The patient's cardiologist is Dr. Gwen Pounds.  ____________________________ Carney Corners. Rudene Re, MD amd:slb D: 05/10/2011 01:40:45 ET T: 05/10/2011 08:34:02 ET JOB#: [id number]  cc: Carney Corners. Rudene Re, MD, <Dictator> Jillene Bucks. Arlana Pouch, MD Zollie Scale MD ELECTRONICALLY SIGNED 05/12/2011 6:41

## 2014-07-21 NOTE — Consult Note (Signed)
CC: urinary retention87 yo male presents to the emergency room unable to urinate since 11pm 05/07/11.  Patient has history of aortic stenosis, CAD, BPH, prostate cancer, HTN, hyperlipidemia, afib and CVA on coumadin.  He was diagnosed with prostate cancer in the last year and is being treated with combined androgen deprivation therapy.  He is followed by Dr. Evelene CroonWolff of Urology and [facility].patient reports occasional hematuria but no recent urinary retention.  He denies fevers, chills, nausea or vomiting.  He has had drops of blood in his underwear over the last 12 hours since being in urinary retention.  Attempts to place catheter in ER were unsuccessful and urology was consulted. s/p stentsstenosiss/p microwave therapy 05/2009 and Greenlight laser TURP 8/2011hernia repairs Hx: by son Hx: no recent weight loss, no fevers, chillsconstipation or diarrhearetention   Temp Temperature : 98.7 degrees F  Temp Source : oral Pulse Pulse : 95  Respirations Respirations : 18  SBP SBP : 144  DBP DBP : 88  Pulse Ox % Pulse Ox % : 100 %  Source : Room Air Pain Scale (0-10) Pain Scale (0-10) : Scale:10 Elderly, NADunlabored breathingsoft, distended bladdercircumcised phallus, blood at meatus1+ pitting edema bilaterally 1.0838pending  After signing informed consent, the patient was prepped and draped in a sterile fashion.  A flexible cystoscope was advanced into the urethra and to the bladder.  There was blood clot and narrowing at the prostatic urethra.  The bladder was visualized and there was minimal clot within the bladder.  A guide wire was placed through the catheter and a 18F 3 way foley catheter was placed to straight drain.  It was difficult to place the foley past the prostatic urethra.  Once in the bladder, the balloon was inflated to 30cc and the bladder was drained.  The bladder was irrigated with 1L NS and a substantial amount of clot was evacuated.  Continuous bladder irrigation  was started throught the irrigation port of the catheter and the urine was clear after a few minutes of CBI.  The patient tolerated this procedure without any issues. 81 yo male with prostate cancer and BPH on coumadin presents with urinary retention and hematuria.  catheter placed by cystoscopy, CBI startedCBI, if urine does not clear up would recommend admission to hospitalist service.  Urology will continue to followurine remains light pink, recommend d/c home with leg bag and catheter plug.UA and treat for possible urinary tract infectionfoley catheter and have patient f/u with Dr. Evelene CroonWolff this week      Electronic Signatures: Janit BernMcNamara, Sharan Mcenaney R (MD)  (Signed on 09-Feb-13 17:42)  Authored  Last Updated: 09-Feb-13 17:42 by Janit BernMcNamara, Andreina Outten R (MD)

## 2014-07-21 NOTE — Consult Note (Signed)
History of Present Illness:   Reason for Consult Neutropenia secondary chemotherapy.    HPI   Patient admitted to the hospital over the weekend with increasing chest pain as well as peripheral edema.  He continues to be weak and fatigued to have a decreased performance status.  Patient states his chest pain has essentially resolved, but still continues of peripheral edema.  He denies any recent fevers. He continues to have urinary retention and still has his Foley catheter in.  He denies any neurologic complaints.  He has no pain. He denies any chest pain or shortness of breath.  He has a poor appetite, but denies weight loss.  He has no nausea, vomiting, constipation, or diarrhea.  Patient offers no further specific complaints today.  PFSH:   Additional Past Medical and Surgical History Past medical history: Atrial fibrillation, hypercholesterolemia, hypertension, CHF, CAD.  Past surgical history: Hernia repair, heart surgery.  Social history: Patient denies tobacco or alcohol.  Family history: Negative and noncontributory.   Review of Systems:   Performance Status (ECOG) 2    Review of Systems   As per HPI, otherwise a 10 system review is negative.  NURSING NOTES: **Vital Signs.:   29-Apr-13 04:53    Vital Signs Type: Routine    Temperature Temperature (F): 98.9    Celsius: 37.1    Temperature Source: oral    Pulse Pulse: 111    Pulse source: per Dinamap    Systolic BP Systolic BP: 107    Diastolic BP (mmHg) Diastolic BP (mmHg): 73    Mean BP: 84    BP Source: Dinamap    Pulse Ox % Pulse Ox %: 93    Pulse Ox Activity Level: At rest    Oxygen Delivery: Room Air/ 21 %   Physical Exam:   Physical Exam General: Ill-appearing lying in bed. Eyes: Pink conjunctiva, anicteric sclera. HEENT: Normocephalic, moist mucous membranes, clear oropharnyx. Lungs: Clear to auscultation bilaterally. Heart: Regular rate and rhythm. No rubs, murmurs, or gallops. Abdomen:  Soft, nontender, nondistended. No organomegaly noted, normoactive bowel sounds. Musculoskeletal: 2+ bilateral lobe showed edema. Neuro: Alert, answering all questions appropriately. Cranial nerves grossly intact. Skin: Rash noted on bilateral lower extremity.   Psych: Normal affect. Lymphatics: No cervical, calvicular, axillary or inguinal LAD.    Sulfa drugs: Rash    Cipro 250 mg oral tablet: 1 tab(s) orally 2 times a day, Active, 0, None   Pravachol 40 mg oral tablet: 1 tab(s) orally once a day (at bedtime) at 7pm, Active, 0, None   ondansetron 4 mg tablet: 1 tab(s) orally every 8 hours, As Needed, Active, 30, 2   bicalutamide 50 mg oral tablet: 1 tab(s) orally once a day at 7am, Active, 30, 5   Calcium 600+D 600 mg-200 intl units oral tablet: 1 tab(s) orally 2 times a day at 7am and 7pm, Active, 0, None   Nucynta 50 mg oral tablet: 1 tab(s) orally every 6 hours, As Needed- for Pain , Active, 30, None   furosemide 40 mg oral tablet: 1 tab(s) orally 2 times a day at 7am and 3pm, Active, 60, 2   Duragesic-12 12 mcg/hr film, extended release: Place one patch onto skiin as directed. Remove patch after 3 days & place a new patch on skin, Active, 10, None   Fosamax 70 mg oral tablet: 1 tab(s) orally once a week, Active, 0, None   Toprol-XL 25 mg oral tablet, extended release: 1 tab(s) orally once a day at 7am,   Active, 0, None  Routine Hem:  29-Apr-13 05:31    WBC (CBC) 0.3   RBC (CBC) 2.98   Hemoglobin (CBC) 9.2   Hematocrit (CBC) 26.7   Platelet Count (CBC) 173   MCV 90   MCH 31.0   MCHC 34.6   RDW 12.7  Routine Chem:  29-Apr-13 05:31    Glucose, Serum 117   BUN 54   Creatinine (comp) 2.10   Sodium, Serum 131   Potassium, Serum 3.6   Chloride, Serum 94   CO2, Serum 27   Calcium (Total), Serum 7.8   Osmolality (calc) 278   eGFR (African American) 32   eGFR (Non-African American) 28   Anion Gap 10  Routine Hem:  29-Apr-13 05:31    Neutrophil % 43.7   Lymphocyte %  35.8   Monocyte % 16.4   Eosinophil % 0.8   Basophil % 3.3   Neutrophil # 0.1   Lymphocyte # 0.1   Monocyte # 0.0   Eosinophil # 0.0   Basophil # 0.0   Assessment and Plan:  Impression:   Stage IV prostate cancer with metastasis to liver, neutropenia secondary chemotherapy.   Plan:   1.  Prostate cancer:  Patient last received chemotherapy with dose reduced Taxotere on July 20, 2011.  His pancytopenia is secondary to his chemotherapy.  Given patient's decreased performance status, it is unlikely he will tolerate any further treatments.  He has been instructed to keep his previously scheduled followup appointments for further evaluation. Neutropenia: Secondary chemotherapy.  Patient was initiated on daily Neupogen shots.  Continue to monitor daily CBC. Thrombocytopenia/anemia: Also secondary chemotherapy.  Transfuse if hemoglobin falls below 8.0 or if platelets fall below 20. Renal failure: Patient's creatinine appears to be at his baseline. Peripheral edema: Likely multifactorial for malignancy as well as recurrent.  Monitor. consult, will follow.   Electronic Signatures: Finnegan, Timothy (MD)  (Signed 29-Apr-13 16:47)  Authored: HISTORY OF PRESENT ILLNESS, PFSH, ROS, NURSING NOTES, PE, ALLERGIES, HOME MEDICATIONS, LABS, ASSESSMENT AND PLAN   Last Updated: 29-Apr-13 16:47 by Finnegan, Timothy (MD) 

## 2014-07-21 NOTE — Consult Note (Signed)
History of Present Illness:   Reason for Consult Hematuria while on Coumadin.    HPI   Patient admitted to the hospital with gross painless hematuria.  Patient states he noted some blood in his urine Friday and proceeded to get worse throughout the weekend including passing clots.  He otherwise feels well.   He denies any neurologic complaints.  He has no pain.  He has no recent fevers or illnesses.  He denies any chest pain or shortness of breath.  He is a good appetite and denies weight loss.  He has no nausea, vomiting, constipation, or diarrhea.  He has no urinary complaints today.  Patient offers no further specific complaints today.  PFSH:   Additional Past Medical and Surgical History Past medical history: Patient fibrillation, hypercholesterolemia, hypertension.  Past surgical history: Hernia repair, heart surgery.  Social history: Patient denies tobacco or alcohol.  Family history: Negative and noncontributory.   Review of Systems:   Performance Status (ECOG) 0    Review of Systems   As per HPI, otherwise a 10 system review is negative.  NURSING NOTES: **Vital Signs.:   11-Feb-13 03:59    Vital Signs Type: Admission    Temperature Temperature (F): 99    Celsius: 37.2    Temperature Source: oral    Pulse Pulse: 84    Pulse source: per Dinamap    Respirations Respirations: 20    Systolic BP Systolic BP: 114    Diastolic BP (mmHg) Diastolic BP (mmHg): 78    Mean BP: 90    BP Source: Dinamap    Pulse Ox % Pulse Ox %: 97    Pulse Ox Activity Level: At rest    Oxygen Delivery: Room Air/ 21 %   Physical Exam:   Physical Exam General: Well-developed, well-nourished, no acute distress. Eyes: Anicteric sclera. Lungs: Clear to auscultation bilaterally. Heart: Regular rate and rhythm. No rubs, murmurs, or gallops. Abdomen: Soft, normoactive bowel sounds. Musculoskeletal: No edema, cyanosis, or clubbing. Neuro: Alert, answering all questions appropriately.  Cranial nerves grossly intact. Skin: No rashes or petechiae noted. Psych: Normal affect. GU: Foley bag noted with red tinged urine.    Sulfa drugs: Rash    alendronate 70 mg tablet: 1 tab(s) orally once a week x 28 days, Active, 4, 11   aspirin : 1 tab(s)  once a day Discontinue prior to surgery  , Active, 0, None   furosemide 40 mg oral tablet: 1 tab(s) orally once a day as needed  , Active, 0, None   Pravachol 40 mg oral tablet: 1 tab(s) orally once a day (at bedtime) , Active, 0, None   CALCIUM AND VITAMIN D :   2 times a day , Active, 0, None   Toprol-XL 50 mg tablet, extended release: 1 tab(s) orally once a day x 30 days , Active, 30, None   bicalutamide 50 mg oral tablet: 1 tab(s) orally every 24 hours, Active, 0, None   Coumadin 3 mg oral tablet: 1 tab(s) orally once a day, Active, 0, None  Assessment and Plan:  Impression:   Prostate cancer, hematuria.  Plan:   1.  Prostate cancer: No evidence of metastatic disease.  Continue with intramuscular Lupron 30 mg every 4 months. Patient has been instructed to keep his [facility]  Hematuria: Treatment per urology.  Agree with holding Coumadin.  Have discussed the case with patient's primary cardiologist, Dr. Gwen Pounds, and he agrees that patient should discontinue Coumadin  until a further cardiac risk assessment can be done as an outpatient.  If patient continues to bleed, would consider vitamin K for reversal of his INR. consult.  Electronic Signatures: Gerarda FractionFinnegan, Timothy (MD)  (Signed 11-Feb-13 14:46)  Authored: HISTORY OF PRESENT ILLNESS, PFSH, ROS, NURSING NOTES, PE, ALLERGIES, HOME MEDICATIONS, ASSESSMENT AND PLAN   Last Updated: 11-Feb-13 14:46 by Gerarda FractionFinnegan, Timothy (MD)
